# Patient Record
Sex: Male | Born: 1950 | Race: Black or African American | Hispanic: No | Marital: Single
Health system: Southern US, Community
[De-identification: ages and names within clinical notes are randomized; demographics above are authoritative.]

---

## 2006-05-26 ENCOUNTER — Observation Stay: Payer: Self-pay | Admitting: Internal Medicine

## 2006-05-26 ENCOUNTER — Other Ambulatory Visit: Payer: Self-pay

## 2006-06-01 ENCOUNTER — Inpatient Hospital Stay: Payer: Self-pay | Admitting: Internal Medicine

## 2006-06-01 ENCOUNTER — Other Ambulatory Visit: Payer: Self-pay

## 2007-09-11 ENCOUNTER — Emergency Department: Payer: Self-pay | Admitting: Emergency Medicine

## 2007-09-11 ENCOUNTER — Other Ambulatory Visit: Payer: Self-pay

## 2009-03-30 ENCOUNTER — Emergency Department: Payer: Self-pay | Admitting: Emergency Medicine

## 2010-11-11 ENCOUNTER — Observation Stay: Payer: Self-pay | Admitting: Internal Medicine

## 2011-03-23 LAB — BASIC METABOLIC PANEL
Anion Gap: 23 — ABNORMAL HIGH (ref 7–16)
Calcium, Total: 8.4 mg/dL — ABNORMAL LOW (ref 8.5–10.1)
Creatinine: 1.12 mg/dL (ref 0.60–1.30)
EGFR (African American): >60
EGFR (Non-African Amer.): >60
Glucose: 410 mg/dL — ABNORMAL HIGH (ref 65–99)
Osmolality: 302 (ref 275–301)
Sodium: 143 mmol/L (ref 136–145)

## 2011-03-23 LAB — CBC
HCT: 38.7 % — ABNORMAL LOW (ref 40.0–52.0)
MCH: 32.8 pg (ref 26.0–34.0)
MCHC: 33.2 g/dL (ref 32.0–36.0)
Platelet: 189 10*3/uL (ref 150–440)
RDW: 12.5 % (ref 11.5–14.5)
WBC: 5.9 10*3/uL (ref 3.8–10.6)

## 2011-03-23 LAB — TROPONIN I: Troponin-I: <0.02 ng/mL

## 2011-03-23 LAB — CK TOTAL AND CKMB (NOT AT ARMC): CK, Total: 182 U/L (ref 35–232)

## 2011-03-24 ENCOUNTER — Inpatient Hospital Stay: Payer: Self-pay | Admitting: Student

## 2011-03-24 LAB — LIPID PANEL
HDL Cholesterol: 75 mg/dL — ABNORMAL HIGH (ref 40–60)
Ldl Cholesterol, Calc: 60 mg/dL (ref 0–100)
Triglycerides: 89 mg/dL (ref 0–200)
VLDL Cholesterol, Calc: 18 mg/dL (ref 5–40)

## 2011-03-24 LAB — HEPATIC FUNCTION PANEL A (ARMC)
Alkaline Phosphatase: 115 U/L (ref 50–136)
Bilirubin,Total: 0.3 mg/dL (ref 0.2–1.0)
SGOT(AST): 75 U/L — ABNORMAL HIGH (ref 15–37)
SGPT (ALT): 59 U/L
Total Protein: 7.8 g/dL (ref 6.4–8.2)

## 2011-03-24 LAB — MAGNESIUM: Magnesium: 1.6 mg/dL — ABNORMAL LOW

## 2011-03-24 LAB — BASIC METABOLIC PANEL
Anion Gap: 17 — ABNORMAL HIGH (ref 7–16)
Anion Gap: 14 (ref 7–16)
BUN: 10 mg/dL (ref 7–18)
BUN: 10 mg/dL (ref 7–18)
Calcium, Total: 7.7 mg/dL — ABNORMAL LOW (ref 8.5–10.1)
Chloride: 108 mmol/L — ABNORMAL HIGH (ref 98–107)
Chloride: 106 mmol/L (ref 98–107)
Creatinine: 0.95 mg/dL (ref 0.60–1.30)
EGFR (African American): >60
Glucose: 264 mg/dL — ABNORMAL HIGH (ref 65–99)
Osmolality: 290 (ref 275–301)
Osmolality: 288 (ref 275–301)
Sodium: 140 mmol/L (ref 136–145)

## 2011-03-24 LAB — URINALYSIS, COMPLETE
Bilirubin,UR: NEGATIVE
Blood: NEGATIVE
Glucose,UR: >=500 mg/dL (ref 0–75)
Nitrite: NEGATIVE
Ph: 6 (ref 4.5–8.0)
RBC,UR: 11 /HPF (ref 0–5)
Squamous Epithelial: 1
WBC UR: 45 /HPF (ref 0–5)

## 2011-03-24 LAB — CK TOTAL AND CKMB (NOT AT ARMC)
CK, Total: 131 U/L (ref 35–232)
CK, Total: 127 U/L (ref 35–232)

## 2011-03-24 LAB — ETHANOL: Ethanol %: 0.225 % — ABNORMAL HIGH (ref 0.000–0.080)

## 2011-03-24 LAB — HEMOGLOBIN A1C: Hemoglobin A1C: 10 % — ABNORMAL HIGH (ref 4.2–6.3)

## 2011-03-25 LAB — BASIC METABOLIC PANEL
Anion Gap: 17 — ABNORMAL HIGH (ref 7–16)
Anion Gap: 10 (ref 7–16)
BUN: 10 mg/dL (ref 7–18)
Calcium, Total: 7.7 mg/dL — ABNORMAL LOW (ref 8.5–10.1)
Calcium, Total: 8.6 mg/dL (ref 8.5–10.1)
Co2: 22 mmol/L (ref 21–32)
EGFR (African American): >60
EGFR (African American): >60
EGFR (Non-African Amer.): >60
EGFR (Non-African Amer.): >60
Glucose: 119 mg/dL — ABNORMAL HIGH (ref 65–99)
Glucose: 238 mg/dL — ABNORMAL HIGH (ref 65–99)
Osmolality: 289 (ref 275–301)
Osmolality: 277 (ref 275–301)
Sodium: 135 mmol/L — ABNORMAL LOW (ref 136–145)

## 2011-03-25 LAB — CBC WITH DIFFERENTIAL/PLATELET
Basophil #: 0 10*3/uL (ref 0.0–0.1)
Eosinophil #: 1.4 10*3/uL — ABNORMAL HIGH (ref 0.0–0.7)
Eosinophil %: 22.1 %
HCT: 35.5 % — ABNORMAL LOW (ref 40.0–52.0)
HGB: 11.7 g/dL — ABNORMAL LOW (ref 13.0–18.0)
Lymphocyte #: 1.3 10*3/uL (ref 1.0–3.6)
Lymphocyte %: 20.4 %
MCH: 32.8 pg (ref 26.0–34.0)
Monocyte #: 0.4 10*3/uL (ref 0.0–0.7)
Neutrophil #: 3.1 10*3/uL (ref 1.4–6.5)
Platelet: 136 10*3/uL — ABNORMAL LOW (ref 150–440)
RBC: 3.57 10*6/uL — ABNORMAL LOW (ref 4.40–5.90)
WBC: 6.2 10*3/uL (ref 3.8–10.6)

## 2011-03-26 LAB — URINE CULTURE

## 2011-04-04 ENCOUNTER — Emergency Department: Payer: Self-pay | Admitting: Emergency Medicine

## 2011-04-04 LAB — COMPREHENSIVE METABOLIC PANEL
Albumin: 3.3 g/dL — ABNORMAL LOW (ref 3.4–5.0)
Alkaline Phosphatase: 68 U/L (ref 50–136)
BUN: 7 mg/dL (ref 7–18)
Bilirubin,Total: 0.3 mg/dL (ref 0.2–1.0)
Calcium, Total: 8.2 mg/dL — ABNORMAL LOW (ref 8.5–10.1)
Co2: 22 mmol/L (ref 21–32)
EGFR (African American): >60
EGFR (Non-African Amer.): >60
Glucose: 477 mg/dL — ABNORMAL HIGH (ref 65–99)
Osmolality: 282 (ref 275–301)
Potassium: 4.8 mmol/L (ref 3.5–5.1)
Sodium: 131 mmol/L — ABNORMAL LOW (ref 136–145)
Total Protein: 6.5 g/dL (ref 6.4–8.2)

## 2011-04-04 LAB — CBC
HCT: 33.5 % — ABNORMAL LOW (ref 40.0–52.0)
HGB: 10.8 g/dL — ABNORMAL LOW (ref 13.0–18.0)
MCH: 32.6 pg (ref 26.0–34.0)
MCV: 102 fL — ABNORMAL HIGH (ref 80–100)
Platelet: 262 10*3/uL (ref 150–440)
RDW: 11.6 % (ref 11.5–14.5)

## 2011-04-04 LAB — PROTIME-INR: Prothrombin Time: 12.4 secs (ref 11.5–14.7)

## 2011-04-04 LAB — LIPASE, BLOOD: Lipase: 32 U/L — ABNORMAL LOW (ref 73–393)

## 2011-04-04 LAB — APTT: Activated PTT: 26.5 secs (ref 23.6–35.9)

## 2011-04-04 LAB — CK TOTAL AND CKMB (NOT AT ARMC): CK, Total: 110 U/L (ref 35–232)

## 2013-05-06 ENCOUNTER — Emergency Department: Payer: Self-pay | Admitting: Emergency Medicine

## 2013-05-06 LAB — COMPREHENSIVE METABOLIC PANEL
ALK PHOS: 88 U/L
ALT: 38 U/L (ref 12–78)
ANION GAP: 7 (ref 7–16)
Albumin: 2.8 g/dL — ABNORMAL LOW (ref 3.4–5.0)
BUN: 12 mg/dL (ref 7–18)
Bilirubin,Total: 0.3 mg/dL (ref 0.2–1.0)
CHLORIDE: 103 mmol/L (ref 98–107)
CO2: 26 mmol/L (ref 21–32)
CREATININE: 1.73 mg/dL — AB (ref 0.60–1.30)
Calcium, Total: 8.3 mg/dL — ABNORMAL LOW (ref 8.5–10.1)
EGFR (African American): 49 — ABNORMAL LOW
EGFR (Non-African Amer.): 42 — ABNORMAL LOW
Glucose: 106 mg/dL — ABNORMAL HIGH (ref 65–99)
OSMOLALITY: 272 (ref 275–301)
Potassium: 4.3 mmol/L (ref 3.5–5.1)
SGOT(AST): 53 U/L — ABNORMAL HIGH (ref 15–37)
Sodium: 136 mmol/L (ref 136–145)
TOTAL PROTEIN: 6.3 g/dL — AB (ref 6.4–8.2)

## 2013-05-06 LAB — URINALYSIS, COMPLETE
BACTERIA: NONE SEEN
Bilirubin,UR: NEGATIVE
Glucose,UR: NEGATIVE mg/dL (ref 0–75)
Ketone: NEGATIVE
Leukocyte Esterase: NEGATIVE
Nitrite: NEGATIVE
PH: 6 (ref 4.5–8.0)
Protein: NEGATIVE
SPECIFIC GRAVITY: 1.005 (ref 1.003–1.030)
Squamous Epithelial: <1

## 2013-05-06 LAB — CBC
HCT: 34 % — ABNORMAL LOW (ref 40.0–52.0)
HGB: 11.3 g/dL — AB (ref 13.0–18.0)
MCH: 32.4 pg (ref 26.0–34.0)
MCHC: 33.3 g/dL (ref 32.0–36.0)
MCV: 97 fL (ref 80–100)
Platelet: 207 10*3/uL (ref 150–440)
RBC: 3.5 10*6/uL — ABNORMAL LOW (ref 4.40–5.90)
RDW: 12.5 % (ref 11.5–14.5)
WBC: 6 10*3/uL (ref 3.8–10.6)

## 2013-05-06 LAB — CK TOTAL AND CKMB (NOT AT ARMC)
CK, Total: 119 U/L
CK-MB: 6.1 ng/mL — AB (ref 0.5–3.6)

## 2013-05-06 LAB — TROPONIN I
Troponin-I: <0.02 ng/mL
Troponin-I: <0.02 ng/mL

## 2013-06-27 IMAGING — CR DG CHEST 2V
1 series · 3 of 3 positions shown · non-contrast
Comparison: none

REASON FOR EXAM: cp
COMMENTS:

PROCEDURE:     DXR - DXR CHEST PA (OR AP) AND LATERAL  - March 23, 2011 [DATE]
RESULT:     Comparison: 11/11/2010

[Series 1: pa · 0.17mm/px · 3 of 3 slices shown]
[im 1/3]
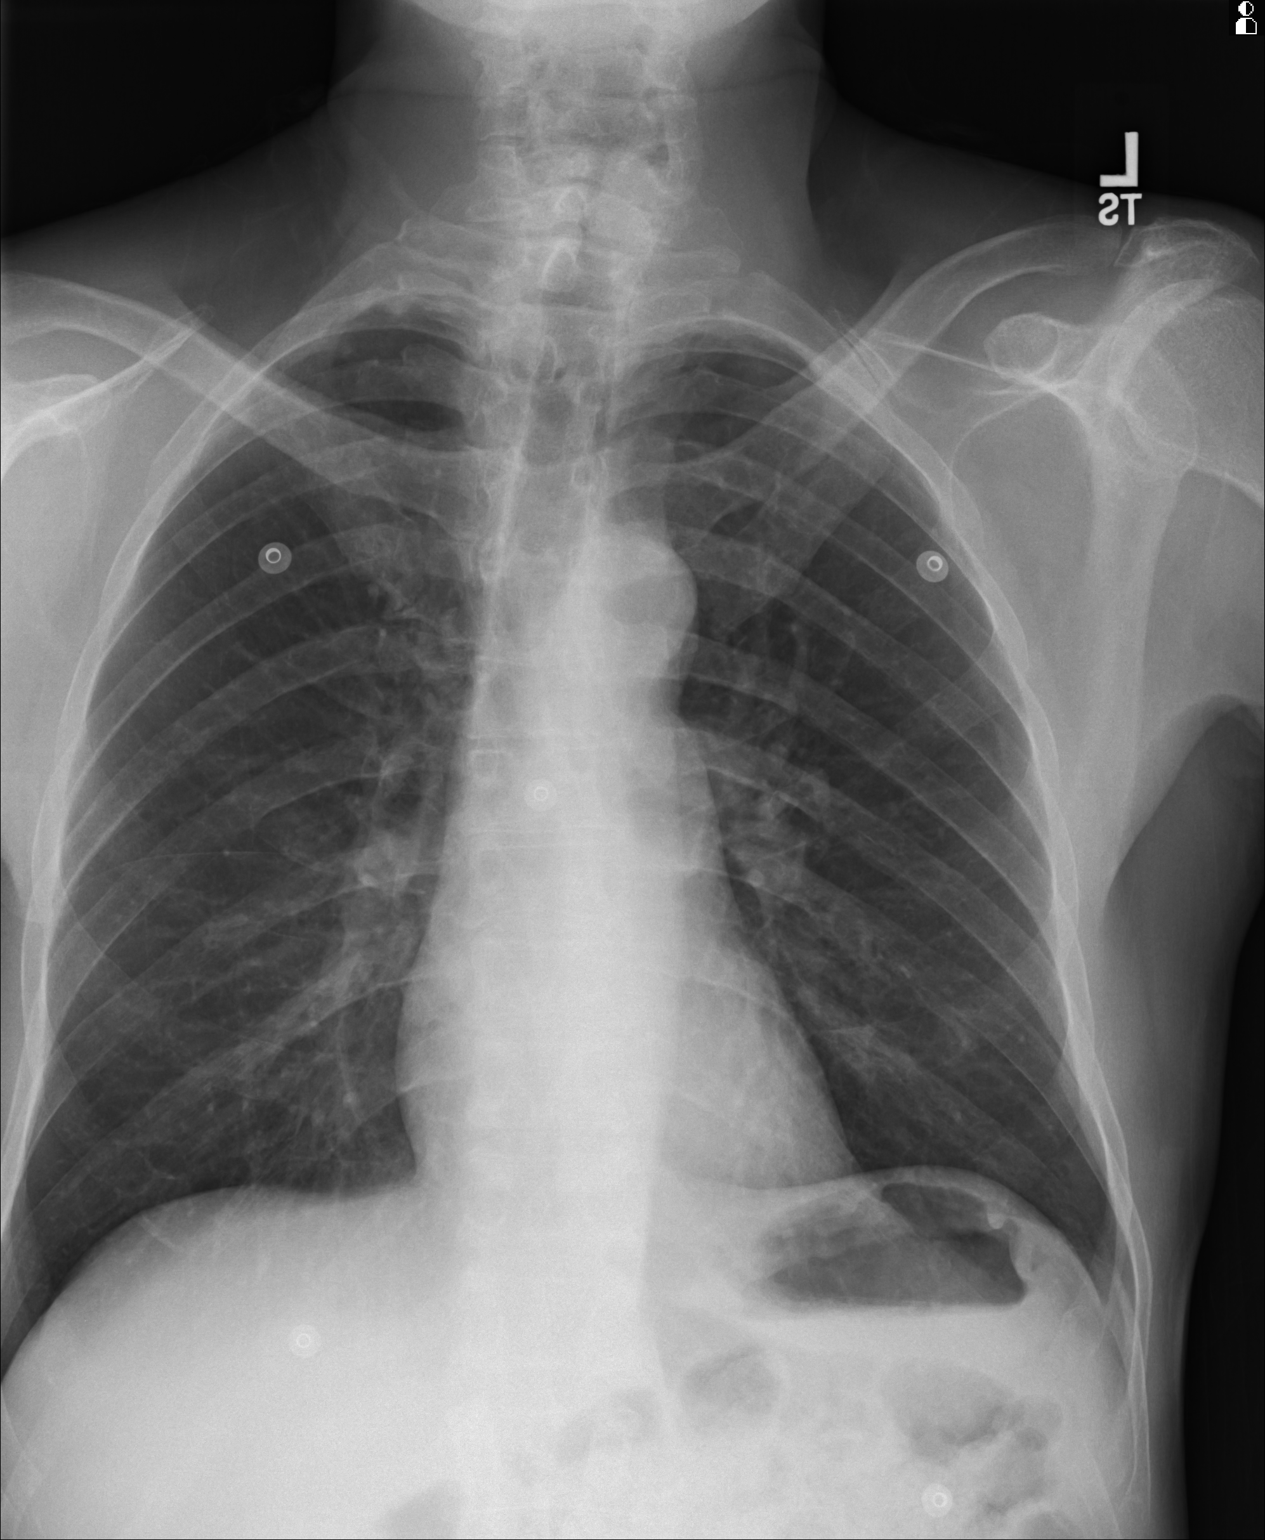
[im 2/3]
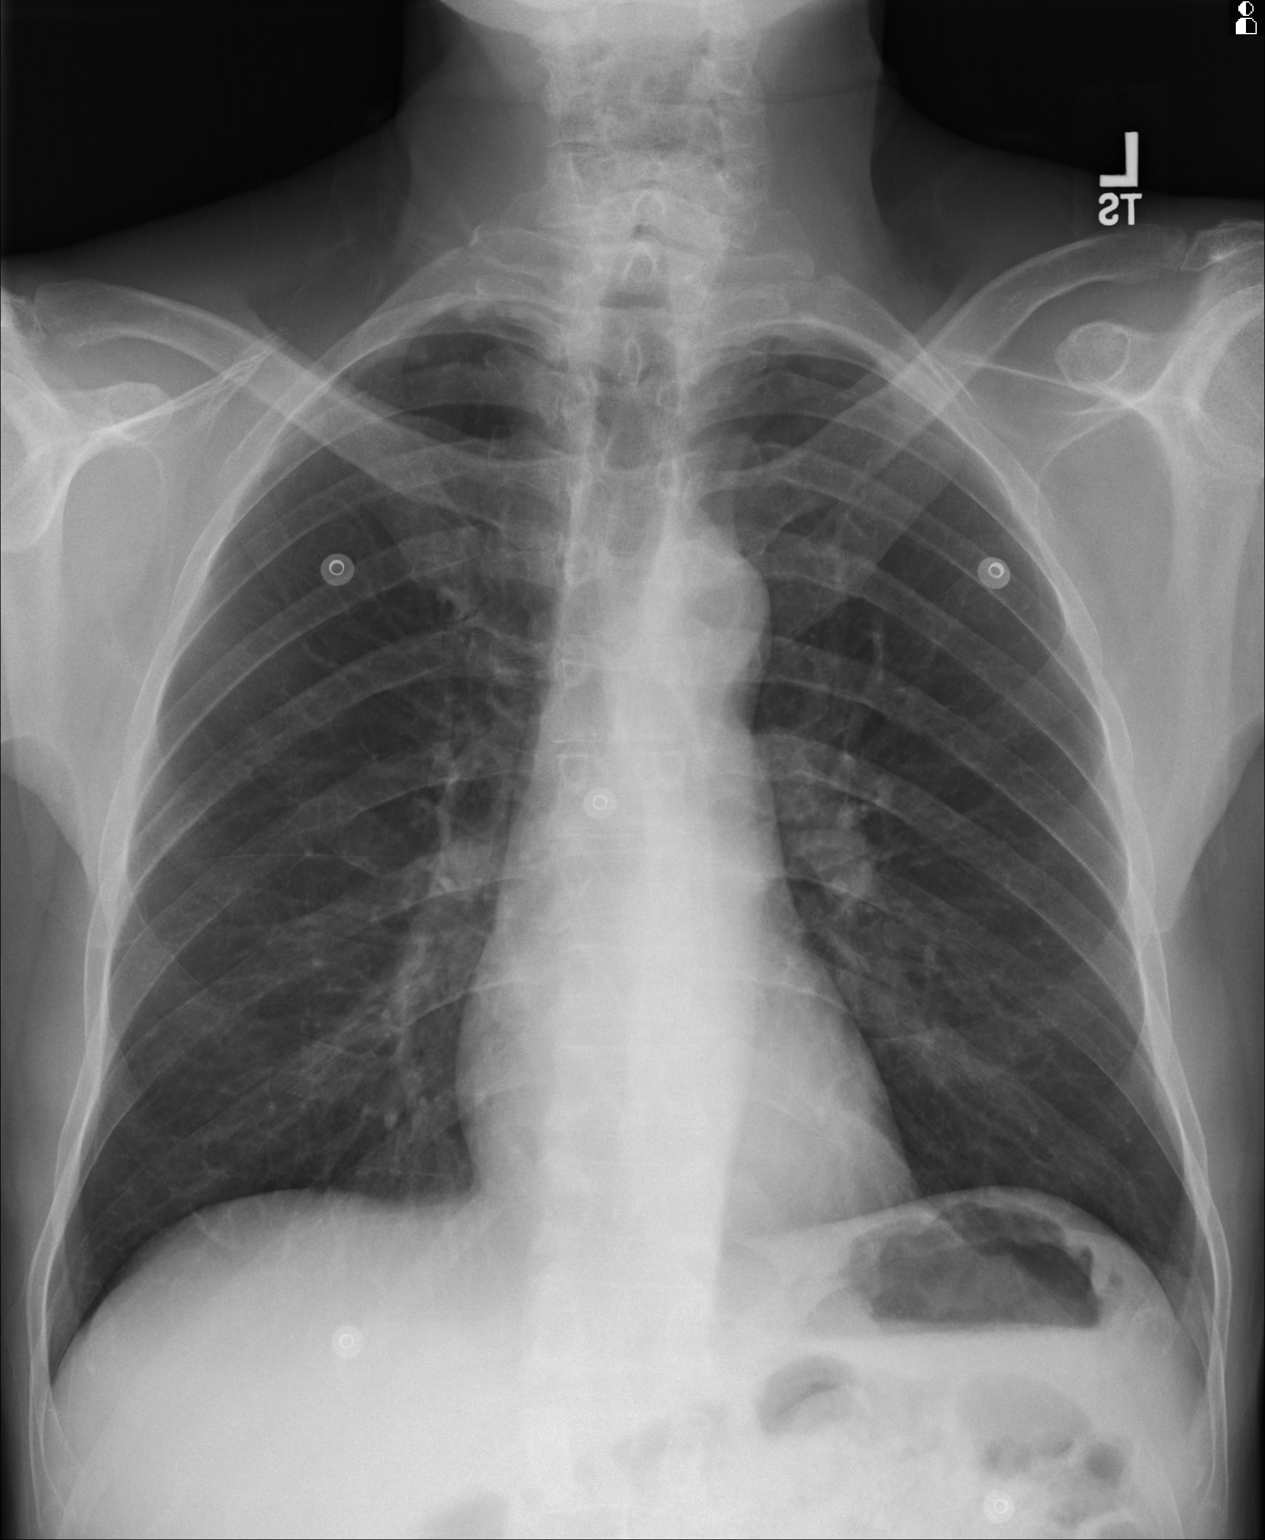
[im 3/3]
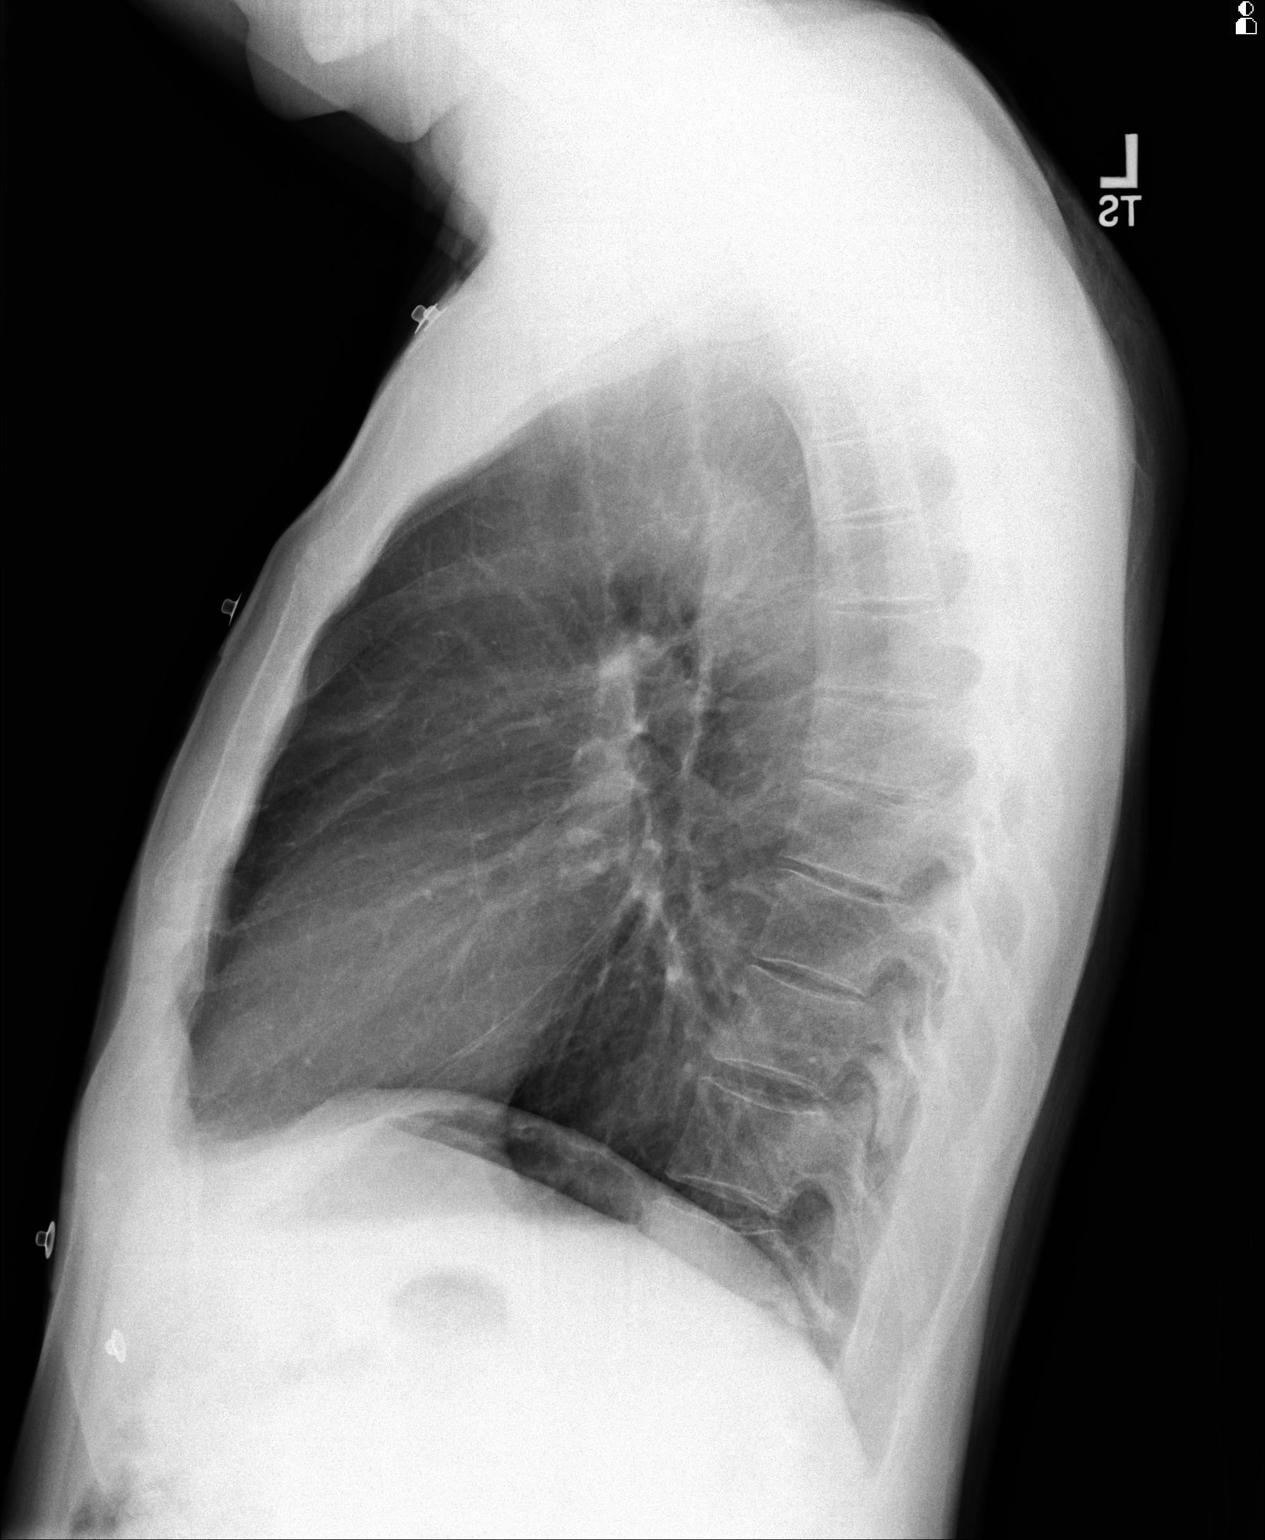

[3 of 3 positions shown; findings below may reference images not displayed]

FINDINGS: PA and lateral chest radiographs are provided.  There is no focal
parenchymal opacity, pleural effusion, or pneumothorax. The heart and
mediastinum are unremarkable.  The osseous structures are unremarkable.
IMPRESSION: No acute disease of the che[REDACTED]

## 2013-06-27 IMAGING — CT CT HEAD WITHOUT CONTRAST
2 series · 16 of 30 positions shown, 20 images · non-contrast
Comparison: none

REASON FOR EXAM: ams/dizziness
COMMENTS:

[Series 2: without · axial · non-contrast · 0.43mm/px · z∈[-163,-23]mm · 13 of 34 slices shown, 17 images]
[im 3/34  brain]
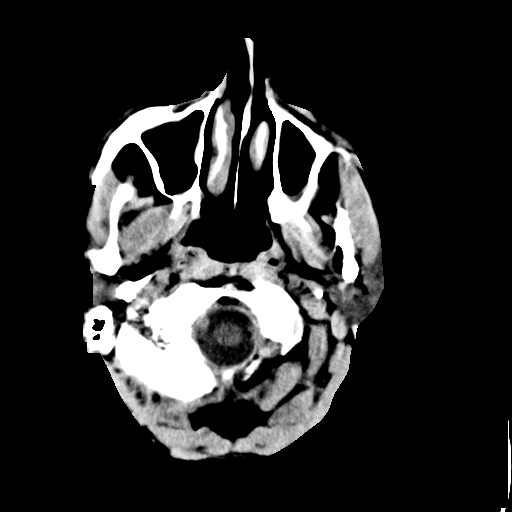
[im 3/34  bone]
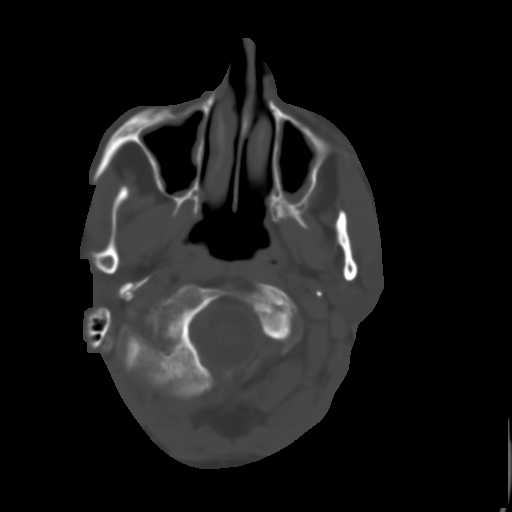
[im 5/34  brain]
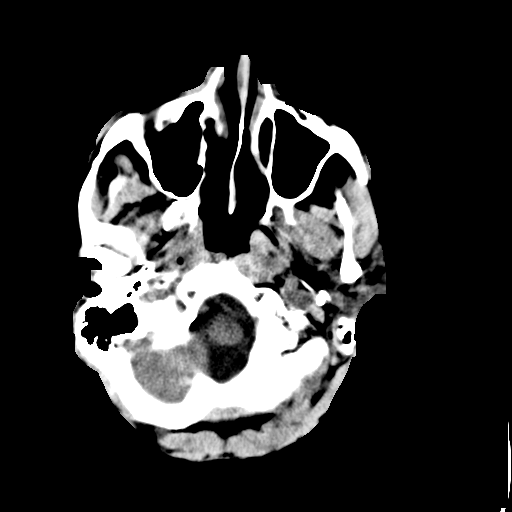
[im 8/34  brain]
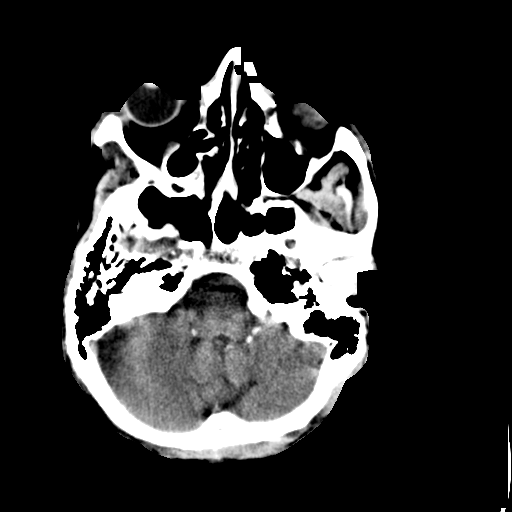
[im 10/34  brain]
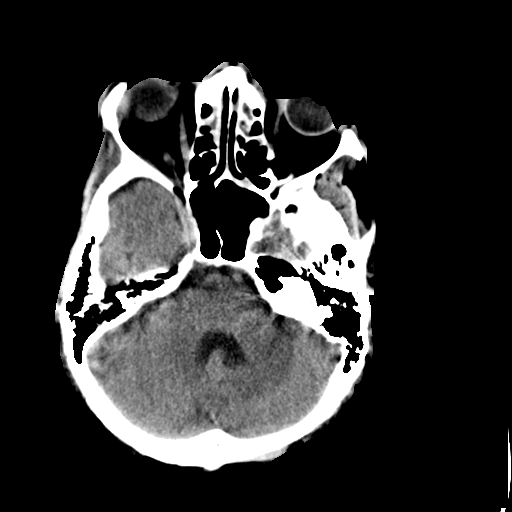
[im 12/34  brain]
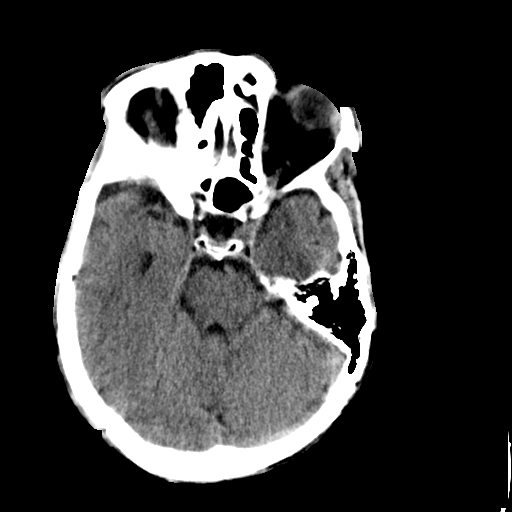
[im 12/34  bone]
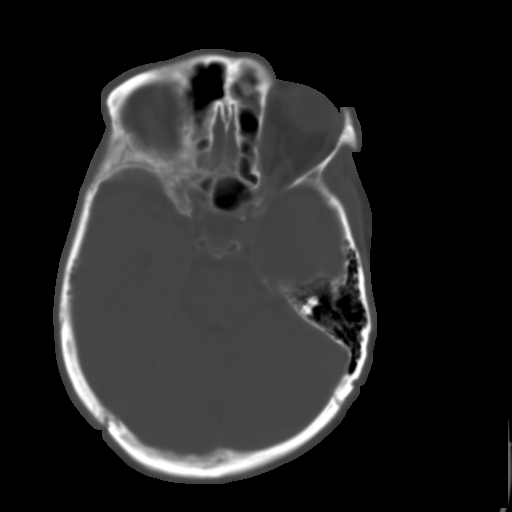
[im 15/34  brain]
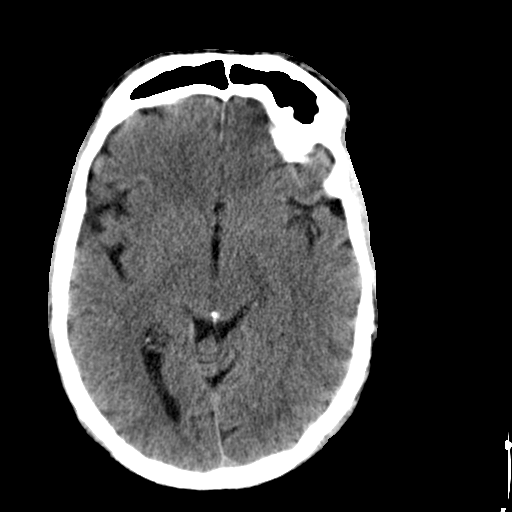
[im 17/34  brain]
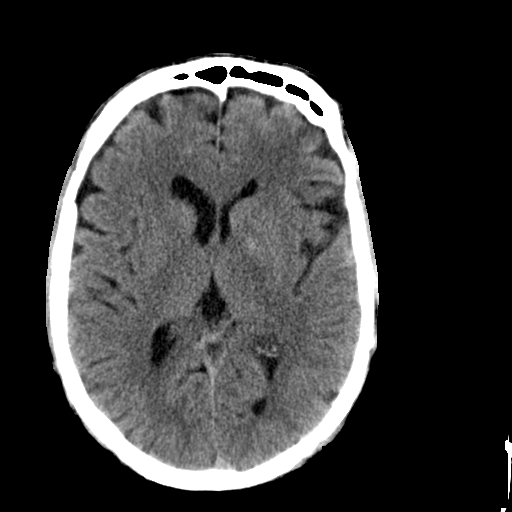
[im 19/34  brain]
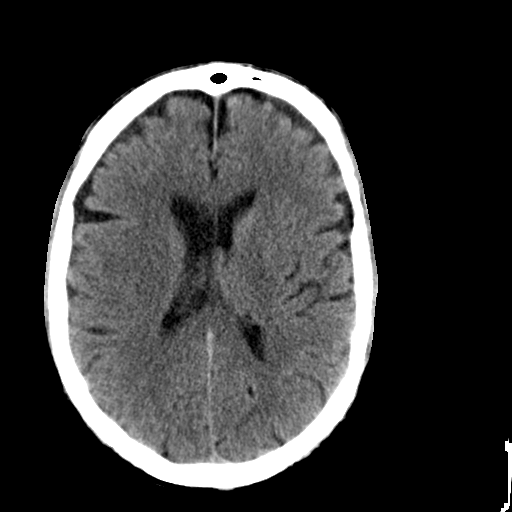
[im 22/34  brain]
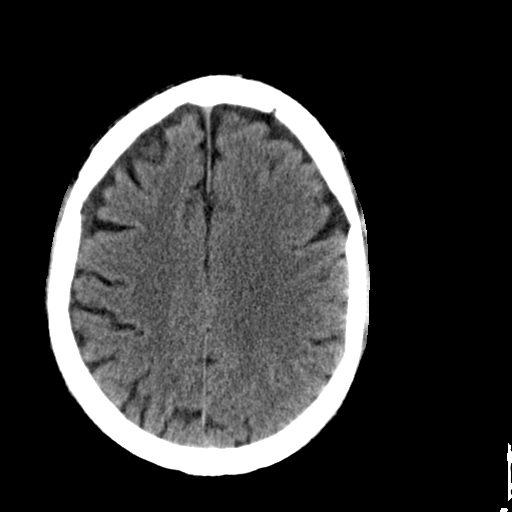
[im 22/34  bone]
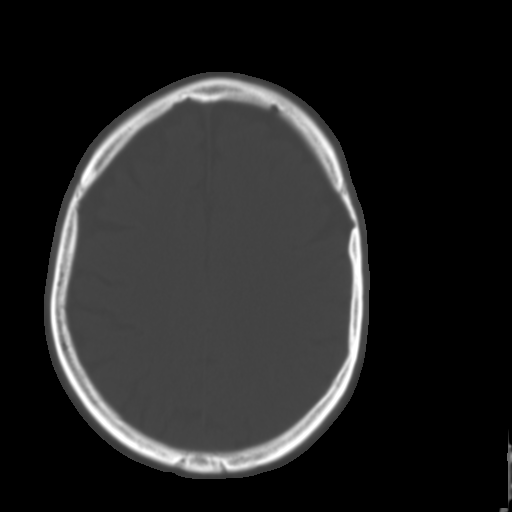
[im 24/34  brain]
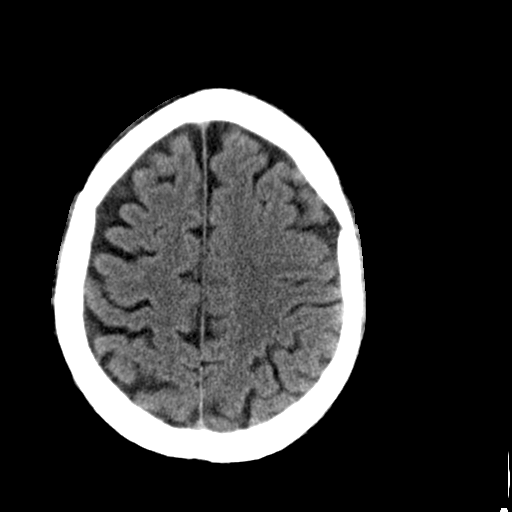
[im 26/34  brain]
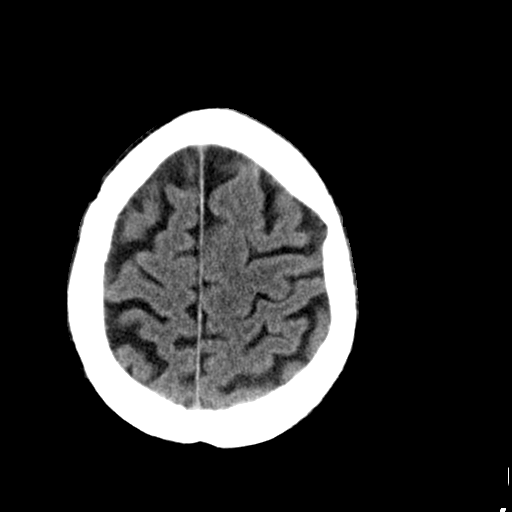
[im 29/34  brain]
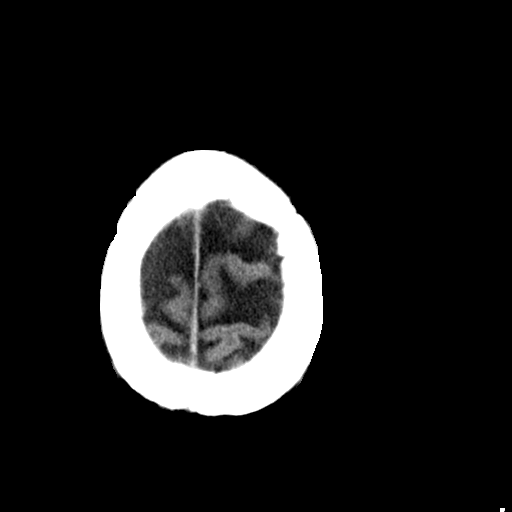
[im 31/34  brain]
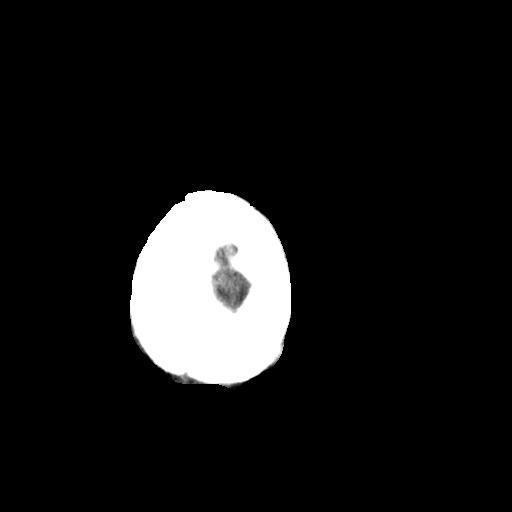
[im 31/34  bone]
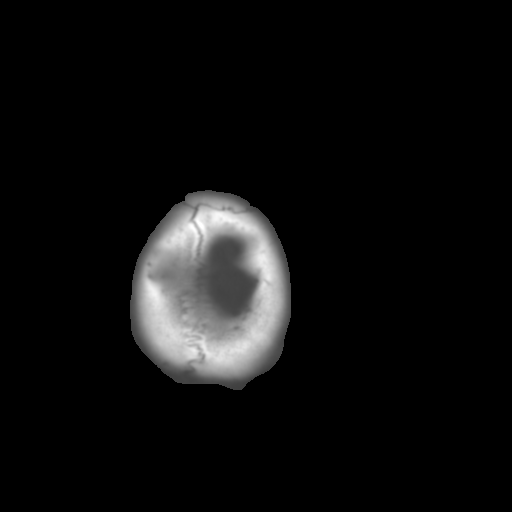

[Series 3: bone · axial · 0.43mm/px · z∈[-163,-118]mm · 3 of 34 slices shown]
[im 3/34  bone]
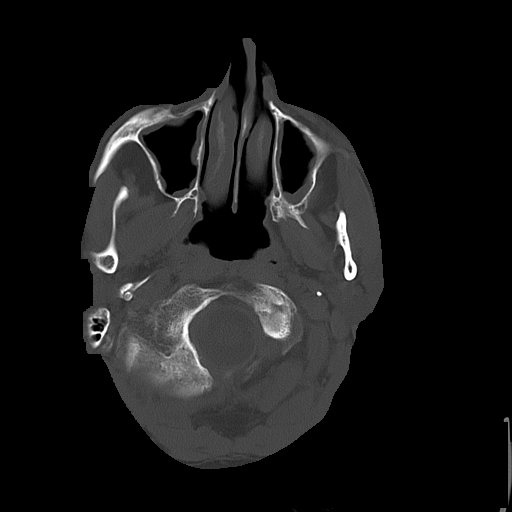
[im 8/34  bone]
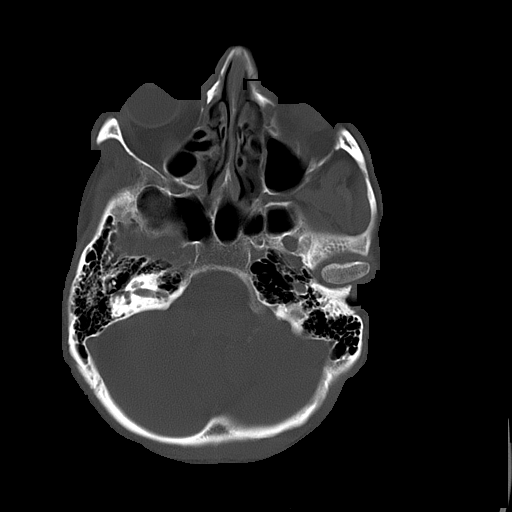
[im 12/34  bone]
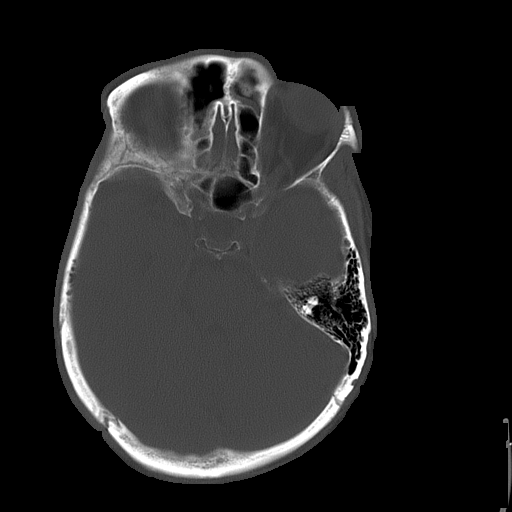

[16 of 30 positions shown; findings below may reference images not displayed]

PROCEDURE:     CT  - CT HEAD WITHOUT CONTRAST  - March 23, 2011 [DATE]

RESULT:     Emergent noncontrast CT of the brain is performed. The patient
has no previous exam for comparison.

The ventricles and sulci are within normal limits for the patient's age.
There is no intracranial hemorrhage, mass effect or midline shift. No
territorial infarct is evident. The calvarium is intact. Diffuse mucosal
thickening is seen within the maxillary sinuses and ethmoid air cells
bilaterally. The sinuses are otherwise clear. The calvarium is intact.
IMPRESSION: 1. No acute intracranial abnormality.
2. Chronic sinus disease.

## 2013-06-28 IMAGING — CR RIGHT HAND - COMPLETE 3+ VIEW
1 series · 3 of 3 positions shown · non-contrast
Comparison: none

REASON FOR EXAM: swelling and pain 5 th finger
COMMENTS:

PROCEDURE:     DXR - DXR HAND RT COMPLETE W/OBLIQUES  - March 24, 2011  [DATE]
RESULT:     Comparison:  None

[Series 1: x hand pa right · 0.14mm/px · 3 of 3 slices shown]
[im 1/3]
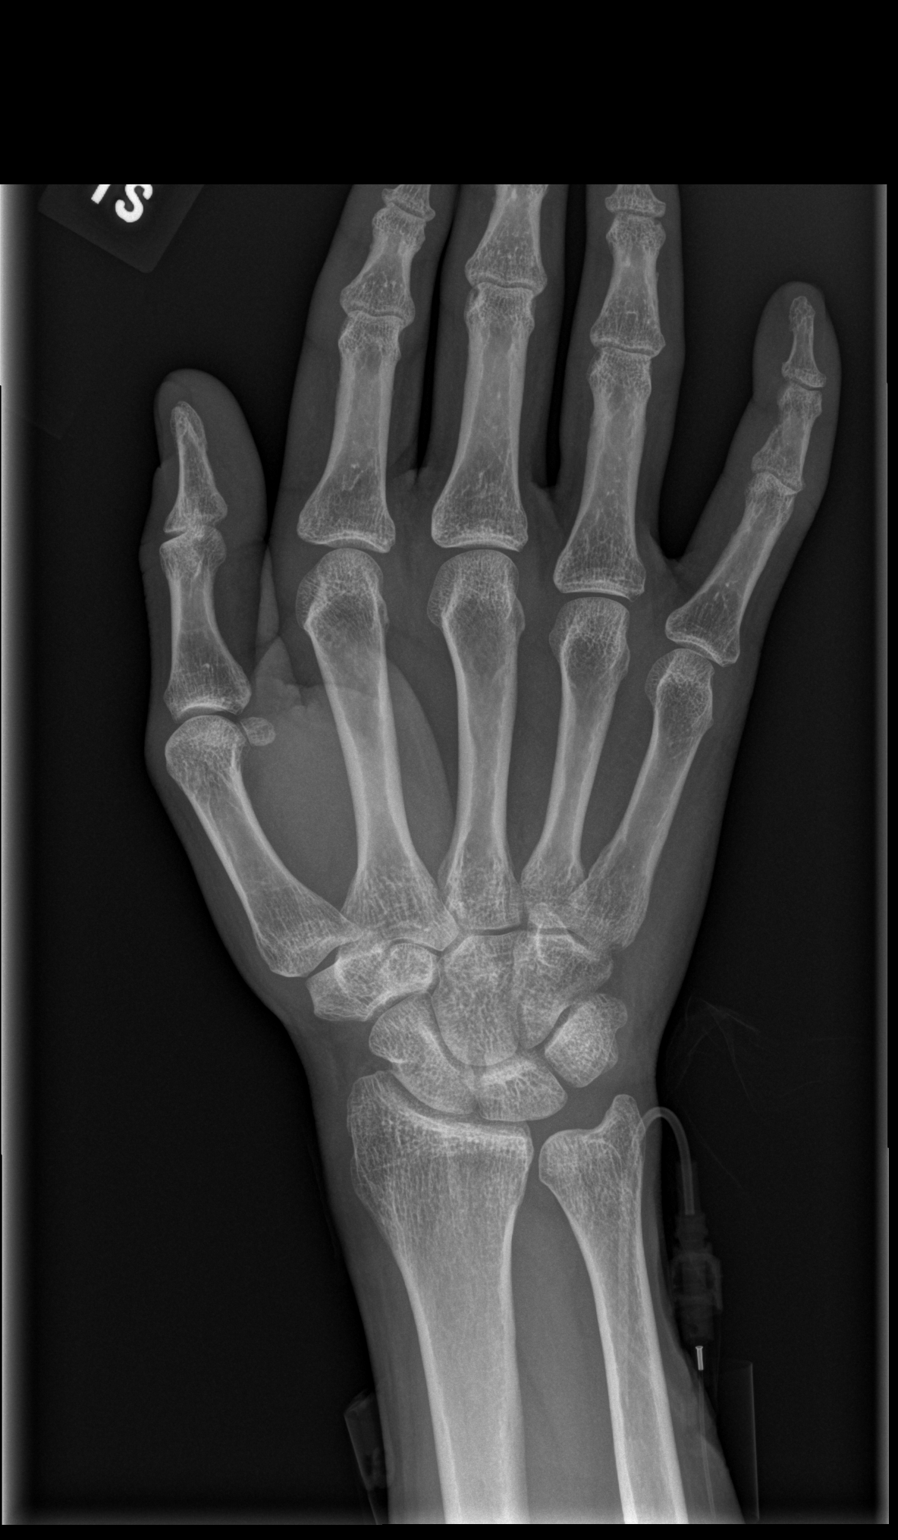
[im 2/3]
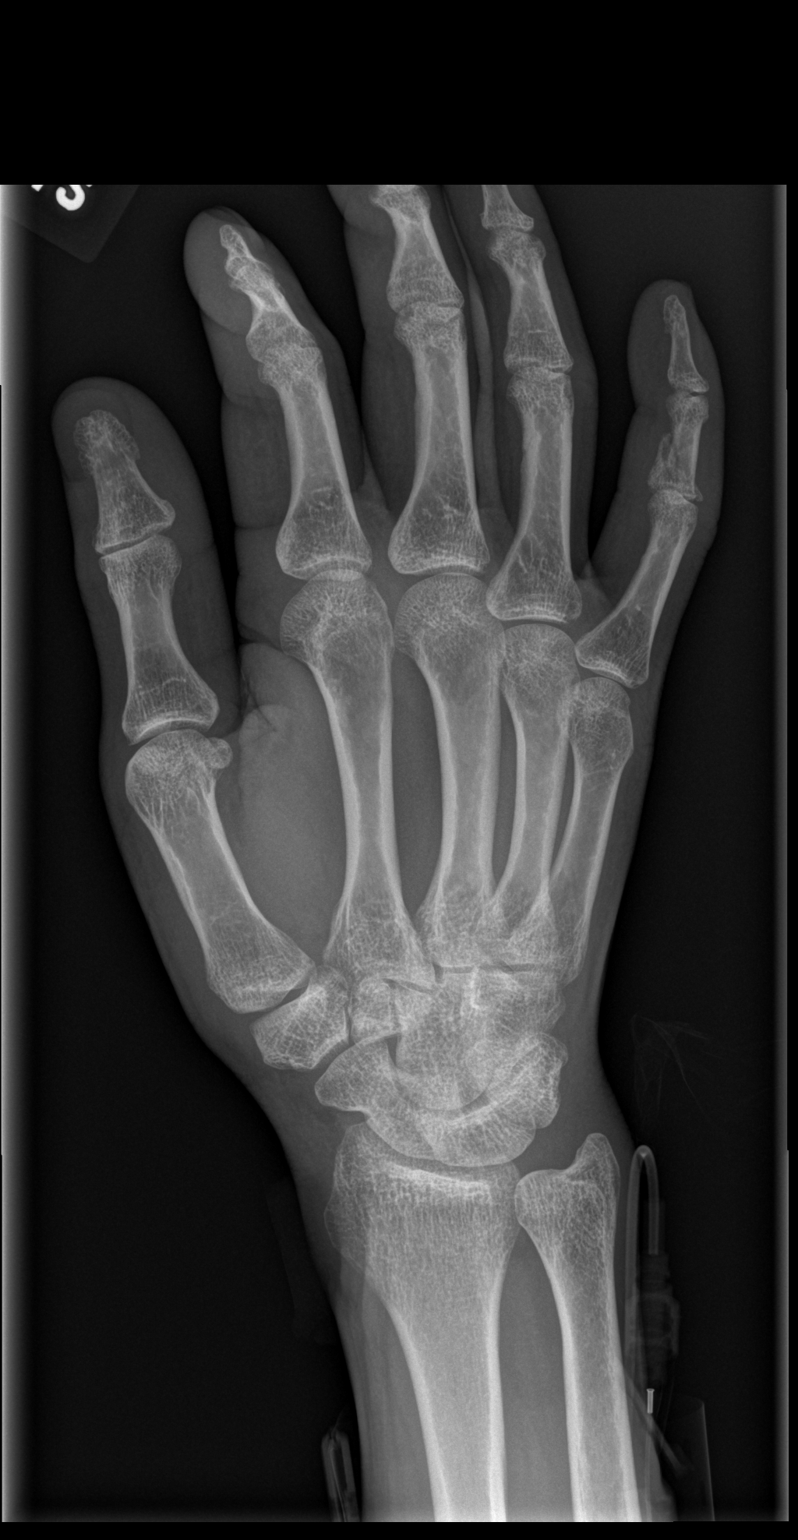
[im 3/3]
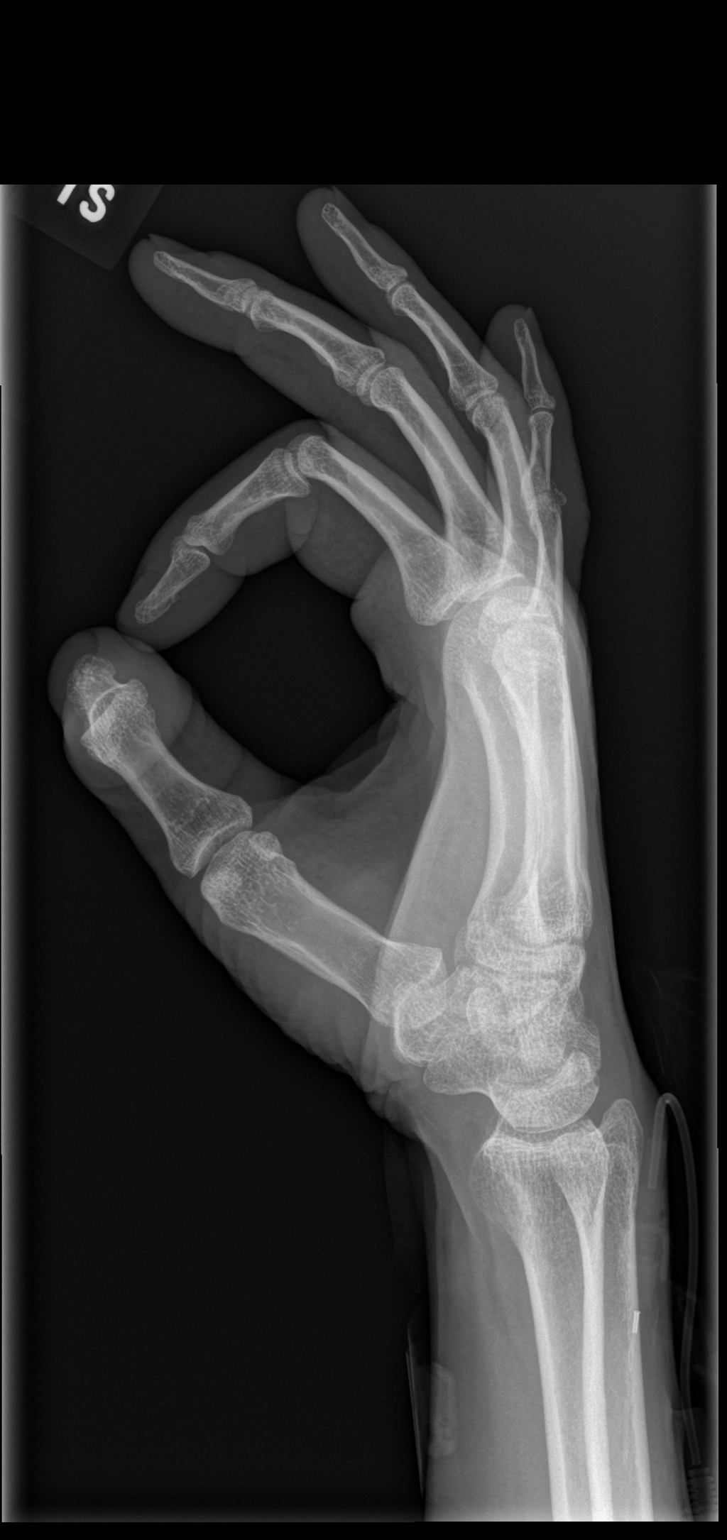

[3 of 3 positions shown; findings below may reference images not displayed]

FINDINGS: AP, oblique, and lateral views of the right hand demonstrates Bapt Dumenil
comminuted oblique fracture of the fifth middle phalanx which appears to
extend to the articular surface of the PIP joint. There is no dislocation.
There is no other fracture. There is surrounding soft tissue swelling.
IMPRESSION: Please see above.

## 2014-04-27 NOTE — Consult Note (Signed)
64 year old male presently in ICU referred for evaluation of right little finger fracture. and xrays reviewed. Fracture of right little finger middle phalanx shows an oblique minimally displaced fracture extending to articular surface. The actual condition of the joint does not appear to be disrupted. I have asked my assistant Lourdes SledgeRonnie Moore to buddy tape the finger to the adjacent finger and apply a dorsal spint. This patient should follow up in my office in about 10 days for follow up xray and exam.  Electronic Signatures: Clare GandySmith, Brittny Spangle E (MD)  (Signed on 21-Mar-13 13:52)  Authored  Last Updated: 21-Mar-13 13:52 by Clare GandySmith, Yoshiye Kraft E (MD)

## 2014-04-27 NOTE — Discharge Summary (Signed)
PATIENT NAME:  Evan Mills, Evan Mills MR#:  [id number] DATE OF BIRTH:  [birth year]  DATE OF ADMISSION:  03/24/2011 DATE OF DISCHARGE:  03/25/2011  CHIEF COMPLAINT: Chest pain.   CONSULTANTS:  1. Adrian Blackwater, MD - Cardiology.  2. Myra Rude, MD - Orthopedics.   DISCHARGE DIAGNOSES:  1. Diabetic ketoacidosis. 2. Alcohol abuse. 3. Hypertension. 4. Fifth digit right hand fracture.  5. Chest pain. 6. Acid reflux. 7. Hyperlipidemia. 8. Diabetes with noncompliance.  DISCHARGE MEDICATIONS: 1. Aspirin 81 mg daily.  2. Humulin R 3 to 4 units with each meal. 3. Insulin NPH 70/30, 20 units in the morning and 10 units in the p.m. prior to meals.  4. Amlodipine 5 mg daily.  5. Lisinopril 5 mg daily.  6. Omeprazole 20 mg daily for 21 days.   DIET: Low sodium, ADA diet.   ACTIVITY: As tolerated.   DISCHARGE FOLLOWUP: Please follow-up with your PCP early next week for diabetes followup. Please followup with Dr. Katrinka Blazing from orthopedics on 04/06/2011.   DISPOSITION: Home.   HISTORY OF PRESENT ILLNESS: For full details, please see the history and physical dictated by Dr. Allena Katz on 03/24/2011, but briefly this is a 64 year old male who came into the ER complaining of chest pain with pins and needles sticking in his chest. He was found to have DKA and was admitted to the hospitalist service for further evaluation and management.   SIGNIFICANT LABS/IMAGING: Sodium 143 on arrival. Initial glucose 389,000. BNP 410. Potassium 3.5, sodium 143. Anion gap 23. Magnesium 1.6. Hemoglobin A1c 10. HDL 75, LDL 60. Ethanol level 225 - 0.225%. LFTs: AST 75, otherwise within normal limits. Troponins negative x3. Initial WBC 5.9, hemoglobin 12.8, hematocrit 38.7.   Urine cultures: No growth to date.   Urinalysis: Positive for 3+ leukocyte esterase, 45 WBCs.   CT of the head without contrast: No acute intracranial abnormality, chronic sinus disease.  X-ray of the chest, PA and lateral: No acute disease of the  chest.   Right hand, complete: Comminuted oblique fracture of the fifth middle phalanx.   HOSPITAL COURSE: The patient was admitted to the Critical Care Unit and although insulin drip was ordered by the time he went to the Critical Care Unit the blood glucose was better and the insulin drip was not started. He was started on IV fluids as well as a banana bag. He has noncompliance with diet as well as insulin. He drinks four alcoholic drinks recently every night with cranberry juice and has not been taking his insulin with meals, which is 3 to 4 units every meal. He states that during the days when he goes out he usually does not take his insulin. His anion gap closed and his electrolytes were repleted and his insulin regimen was changed to NPH 70/30, 20 units in the morning and 10 units at night with the sliding scale that he is supposed to be on during the day. His hemoglobin A1c was significantly elevated at 10 and alcohol consumption cessation as well as insulin regimen adherence was strongly recommended and the patient agreed. He has no further abdominal pain. For his chest pain he was evaluated by cardiology, Dr. Welton Flakes. This was atypical, had negative troponins x3, and this was felt to be GI related and he would be discharged on omeprazole for 21 days. While hospitalized his blood pressure was noted to be elevated and given that he is a diabetic he would be started on lisinopril as well as low dose amlodipine and titrated as  an outpatient. He did have swelling and tenderness on his right fifth digit of the upper extremity and x-rays did show a fracture. He was seen by Dr. Katrinka BlazingSmith from orthopedics and the fingers were buddy-taped, including the fourth and fifth digit. His pain is controlled. He is to follow up with Dr. Katrinka BlazingSmith as an outpatient in 10 days. While hospitalized he did not have any withdrawal symptoms. His chest pain has resolved. At this point, he will be discharged [facility].   CODE STATUS: THE PATIENT IS FULL CODE   TOTAL TIME SPENT: 35 minutes.  ____________________________ Krystal EatonShayiq Freida Nebel, MD sa:slb D: 03/25/2011 15:42:00 ET T: 03/25/2011 16:48:34 ET JOB#: [id number]  cc: Krystal EatonShayiq Shameika Speelman, MD, <Dictator> [facility] Marcelle SmilingSHAYIQ [facility] IncHMADZIA MD ELECTRONICALLY SIGNED 04/10/2011 15:06

## 2014-04-27 NOTE — H&P (Signed)
PATIENT NAME:  Evan Mills, [name] MR#:  [id number] DATE OF BIRTH:  November 20, 1950  DATE OF ADMISSION:  03/24/2011  [facility]  ED REFERRING PHYSICIAN: Dr. Ladona Ridgelaylor  CHIEF COMPLAINT: Chest pain.   HISTORY OF PRESENT ILLNESS: Patient is a 64 year old male with history of diabetes, hypertension, hyperlipidemia who had presented with similar complaints in November with chest pain. Reports that earlier today he started having similar type of chest pain in the left side of his chest feeling like pins and needles sticking in his chest. He was also having according to him worsening of his stuttering therefore. His brother recommended that he come to the ED. In the ER, patient was noted to have an elevated alcohol level as well as a blood glucose of 410. He was also noticed to have anion gap elevated of 23. Therefore, I was asked to admit the patient. Patient reports that since he had negative stress test in November he has had occasional chest pain unrelated to activity. Today he reports that he was also a little short of breath. Patient reports that he has chronic stutter and the stuttering was also worse earlier today, but now improved. When he was waiting outside in the ED he threw up three times. He has not had any abdominal pain. He reports that earlier in the day he had about four drinks of liquor. Otherwise, he denies any chest pain, shortness of breath. Denies any urinary symptoms. Patient also reports that about two weeks ago he noticed his right pinky finger was swollen, was seen at urgent care, was diagnosed with cellulitis, was treated with antibiotics. He reports that erythema is gone but the swelling has come back in his right hand fifth finger.  PAST MEDICAL HISTORY:  1. Hypertension.  2. Diabetes type 2 for 15 years.  3. Hyperlipidemia.  4. History of headaches which have now since resolved.   PAST SURGICAL HISTORY: Appendectomy.   ALLERGIES: None.    MEDICATIONS:   1. Aspirin 81, 1 tab p.o. daily.  2. Fish oil 1 capsule b.i.d.  3. NPH 10 units at bedtime.  4. Humulin 3 to 4 units subcutaneous prior to eating.  5. Omega-3 1 capsule p.o. b.i.d.   SOCIAL HISTORY: Smokes pipe occasionally. He reports that he only drinks 1 to 2 beers every few days. He is divorced.   FAMILY HISTORY: Father with diabetes.   REVIEW OF SYSTEMS: CONSTITUTIONAL: Denies any fevers, fatigue. Complains of weakness today. No pain. No weight loss. No weight gain. EYES: No blurred or double vision. No pain. No redness. No inflammation. No glaucoma. No cataracts. ENT: No tinnitus. No ear pain. No hearing loss. No seasonal or year-round allergies. No epistaxis. No nasal discharge. No postnasal drip. No difficulty swallowing. RESPIRATORY: No cough. No wheezing. No hemoptysis. No chronic obstructive pulmonary disease. No tuberculosis, pneumonia. CARDIOVASCULAR: Chest pain as above. No orthopnea. No edema. No arrhythmia. No palpitations. No syncope. GASTROINTESTINAL: Nausea and vomiting as above. No diarrhea. No abdominal pain. No hematemesis. No melena. No ulcers. GENITOURINARY: Denies any dysuria, hematuria, renal calculus, or frequency. ENDO: Denies any polyuria, nocturia, or thyroid problems. Denies any increased sweating or heat or cold intolerance. HEME/LYMPH: Denies anemia, easy bruisability, or bleeding. SKIN: No acne. No rash. No changes in mole, hair or skin. MUSCULOSKELETAL: Denies any pain in neck, back, or shoulder. Patient does complain of pain in his right hand fifth finger. NEUROLOGICAL: No numbness. No cerebrovascular accident. No transient ischemic attack. No seizures. PSYCHIATRIC: No anxiety.  No insomnia. No ADD. No OCD.   PHYSICAL EXAMINATION:  VITAL SIGNS: Temperature 98.2, pulse 96, respirations 20, blood pressure 161/90, O2 98%.   GENERAL: Patient is an [place] male in no acute distress, smells of alcohol.   HEENT: Head atraumatic, normocephalic. Pupils equal,  round, reactive to light and accommodation. Extraocular movements intact. Anicteric sclerae. No conjunctival pallor. Oropharynx is clear without any exudates. Nasal exam shows no drainage or ulceration. Ear exam shows no drainage or erythema.   NECK: No thyromegaly. No carotid bruits.   CARDIOVASCULAR: Regular rate and rhythm. No murmurs, rubs, clicks, or gallops. PMI is not displaced.   LUNGS: Clear to auscultation bilaterally without any rales, rhonchi, wheezing.   ABDOMEN: Soft, nontender, nondistended. Positive bowel sounds x4.   EXTREMITIES: No clubbing, cyanosis, edema.   NEUROLOGIC: Awake, alert, oriented x3.   SKIN: No rash.   LYMPHATICS: No lymph nodes palpable.   MUSCULOSKELETAL: Right hand fifth finger is swollen but no erythema. He has limited range of motion in that finger.   NEUROLOGICAL: Awake, alert, oriented x3. Has chronic stuttering, that is unchanged.   PSYCHIATRIC: Not anxious or depressed.   LABORATORY, DIAGNOSTIC, AND RADIOLOGICAL DATA: Blood glucose 410, BUN 13, creatinine 1.12, sodium 143, potassium 3.5, chloride 105, CO2 15, anion gap 23, calcium 8.4. Alcohol level 0.225. Total protein 7.8, albumin 4.2, bilirubin total 0.3, bilirubin direct 0.1, AST 75, ALT 59. WBC 5.9, hemoglobin 12.8, platelet count 189. Troponin less than 0.02. CK-MB 3.9. EKG showed normal sinus rhythm, criteria for left ventricular hypertrophy, nonspecific T wave changes.   ASSESSMENT AND PLAN: Patient is 64 year old male with history of hypertension, diabetes, hyperlipidemia presents with multiple complaints.  1. Diabetic ketoacidosis. Will place him on insulin drip, IV fluids. Check BMP q.6 hours. Once his anion gap is closed will discontinue insulin drip and switch him over to subcutaneous insulin. Will also follow his electrolytes including potassium. Add potassium to his IV fluids as needed.  2. Chest pain. Negative stress test in November 2012. Continues to have symptoms. There is some  reproducible nature to his symptoms. Will check serial cardiac enzymes. Keep him on aspirin. I will ask cardiology to see the patient.  3. Diabetes. He will be on insulin drip for DKA. Will check a hemoglobin A1c. 4. Hypertension. Currently not on any treatment. Blood pressure is elevated. Will monitor. He may need to be started on oral regimen, likely would benefit from ACE inhibitor.  5. Hyperlipidemia. Continue fish oil. Will check a fasting lipid panel.  6. Right fifth finger swelling. Will check an x-ray to look for any fracture, foreign objects, any arthritic changes. No evidence of infection right now.  7. Elevated alcohol level. Patient insists on two beers every day, not 100% sure that he drinks that, he probably drinks more than that. Will place him on CIWA protocol in case there is alcohol abuse.  8. Miscellaneous. Will place him on Lovenox for deep vein thrombosis prophylaxis.   TIME SPENT: 35 minutes.   ____________________________ Lacie Scotts Allena Katz, MD shp:cms D: 03/24/2011 01:18:20 ET T: 03/24/2011 06:43:24 ET JOB#: [id number]  cc: Winifred Bodiford H. Allena Katz, MD, <Dictator> [facility] Charise Carwin MD ELECTRONICALLY SIGNED 03/24/2011 6:59

## 2014-04-27 NOTE — Consult Note (Signed)
PATIENT NAME:  Evan Mills, Derrill B MR#:  [id number] DATE OF BIRTH:  [birth year]  DATE OF CONSULTATION:  03/24/2011  CONSULTING PHYSICIAN:  Laurier NancyShaukat A. Khan, MD  INDICATION FOR CONSULTATION: Chest pain.   HISTORY OF PRESENT ILLNESS: The patient is a 64 year old male with a past medical history of diabetes, hypertension, hyperlipidemia, presented to the Emergency Room with chest pain. The chest pain was very similar in character. It is described as sharp pain in the left side of his chest like a needle sticking in his chest. He had similar complaints in November 2012 and had a negative stress Myoview. He also was found in the Emergency Room to have elevated alcohol level and elevated blood glucose level to 410. He also had an anion gap of 23. He denies any chest pain right now but has been drinking for the past one week intermittently and is feeling some indigestion.   PAST MEDICAL HISTORY: As mentioned: 1. History of hypertension. 2. Diabetes. 3. Hyperlipidemia. 4. History of GI reflux. 5. History of alcoholic gastritis.   MEDICATIONS: Aspirin, NPH insulin, Humulin insulin, Omega-3 capsules.   SOCIAL HISTORY: He smokes a pipe occasionally and, as mentioned above, drinks beer frequently.   FAMILY HISTORY: Positive for coronary artery disease.   PHYSICAL EXAMINATION:  GENERAL: He is alert, oriented x3, in no acute distress right now.   VITAL SIGNS: Stable.   NECK: Exam revealed no JVD.   LUNGS: Clear.  HEART: Regular rate and rhythm. Normal S1, S2. No audible murmur.   ABDOMEN: Soft, nontender, positive bowel sounds.   EXTREMITIES: No pedal edema.   DIAGNOSTIC DATA: EKG shows normal sinus rhythm, mild nonspecific ST-T changes, early repolarization abnormalities probably causing these changes with left ventricular hypertrophy.   ASSESSMENT AND PLAN: The patient has atypical chest pain, negative stress Myoview recently in November.  I think most of his chest pain may be related to alcoholic  gastritis and acid reflux. He already had a work-up, and he is right now basically being admitted for diabetic ketoacidosis. I agree with the treatment plan. No further work-up necessary at this time.   Thank you very much for the referral.  ____________________________ Laurier NancyShaukat A. Khan, MD sak:cbb D: 03/24/2011 12:44:07 ET T: 03/24/2011 13:07:56 ET JOB#: [id number]  cc: Laurier NancyShaukat A. Khan, MD, <Dictator> Laurier NancySHAUKAT A KHAN MD ELECTRONICALLY SIGNED 03/29/2011 8:23

## 2015-04-04 DEATH — deceased

## 2016-11-03 ENCOUNTER — Telehealth: Payer: Self-pay | Admitting: Family Medicine

## 2016-11-03 NOTE — Telephone Encounter (Signed)
Copied from CRM #[id number]. Topic: General - Other >> Nov 02, 2016 11:13 AM Viviann SpareWhite, Selina wrote: Reason for CRM: Rec'd a call from the physical therapist Cleotis Nipperhomas Cain [phone]. He stated that he was unable to treat the patient today because his heart rate was 127 a beat at rest. Please have Dr. Laural BenesJohnson call him if, she has any questions. >> Nov 03, 2016  8:06 AM Reel, Gwenith Spitziffany L, CMA wrote: Lorain ChildesFYI

## 2016-11-03 NOTE — Telephone Encounter (Signed)
Called and left a message letting him know that we have never seen this patient.

## 2016-11-03 NOTE — Telephone Encounter (Signed)
Please let PT know that I have never seen this patient and that he is not my patient. Thanks!
